# Patient Record
Sex: Male | Born: 1940 | Race: White | Hispanic: No | Marital: Married | Smoking: Never smoker
Health system: Southern US, Community
[De-identification: ages and names within clinical notes are randomized; demographics above are authoritative.]

## PROBLEM LIST (undated history)

## (undated) DIAGNOSIS — B029 Zoster without complications: Secondary | ICD-10-CM

---

## 2019-03-31 ENCOUNTER — Other Ambulatory Visit: Payer: Self-pay

## 2019-03-31 ENCOUNTER — Emergency Department
Admission: EM | Admit: 2019-03-31 | Discharge: 2019-03-31 | Disposition: A | Discharge status: Home / Self Care | Payer: Medicare Other | Source: Home / Self Care

## 2019-03-31 ENCOUNTER — Emergency Department (INDEPENDENT_AMBULATORY_CARE_PROVIDER_SITE_OTHER): Payer: Medicare Other

## 2019-03-31 DIAGNOSIS — R05 Cough: Secondary | ICD-10-CM

## 2019-03-31 DIAGNOSIS — J129 Viral pneumonia, unspecified: Secondary | ICD-10-CM

## 2019-03-31 DIAGNOSIS — J189 Pneumonia, unspecified organism: Secondary | ICD-10-CM

## 2019-03-31 DIAGNOSIS — R059 Cough, unspecified: Secondary | ICD-10-CM

## 2019-03-31 HISTORY — DX: Zoster without complications: B02.9

## 2019-03-31 LAB — POC SARS CORONAVIRUS 2 AG -  ED: SARS Coronavirus 2 Ag: NEGATIVE

## 2019-03-31 MED ORDER — AZITHROMYCIN 250 MG PO TABS: ORAL_TABLET | ORAL | 0 refills | Status: AC

## 2019-03-31 MED ORDER — ALBUTEROL SULFATE HFA 108 (90 BASE) MCG/ACT IN AERS: 2.0000 | INHALATION_SPRAY | Freq: Four times a day (QID) | RESPIRATORY_TRACT | 0 refills | Status: AC | PRN

## 2019-03-31 NOTE — ED Triage Notes (Signed)
Cough for about 1 week.

## 2019-03-31 NOTE — ED Provider Notes (Signed)
[facility]    CSN: [id number] Arrival date & time: 03/31/19  1325      History   Chief Complaint Chief Complaint  Patient presents with  . Cough    HPI Benjamin Wolf is a 78 y.o. male.   The history is provided by the patient. No language interpreter was used.  Cough Cough characteristics:  Non-productive Sputum characteristics:  Nondescript Timing:  Constant Progression:  Worsening Chronicity:  New Relieved by:  Nothing Worsened by:  Nothing Ineffective treatments:  None tried Risk factors: no recent infection   Pt complains of cough and congestion.  Pt request rx for zithromax and tussionex  Past Medical History:  Diagnosis Date  . Shingles     There are no problems to display for this patient.        Home Medications    Prior to Admission medications   Medication Sig Start Date End Date Taking? Authorizing Provider  albuterol (VENTOLIN HFA) 108 (90 Base) MCG/ACT inhaler Inhale 2 puffs into the lungs every 6 (six) hours as needed for wheezing or shortness of breath. 03/31/19   Elson Areas, PA-C  azithromycin (ZITHROMAX Z-PAK) 250 MG tablet Take 2 tablets (500 mg) on  Day 1,  followed by 1 tablet (250 mg) once daily on Days 2 through 5. 03/31/19 04/05/19  Elson Areas, PA-C    Family History No family history on file.  Social History Social History   Tobacco Use  . Smoking status: Never Smoker  . Smokeless tobacco: Never Used  Substance Use Topics  . Alcohol use: Not Currently  . Drug use: Not Currently     Allergies   Patient has no allergy information on record.   Review of Systems Review of Systems  Respiratory: Positive for cough.   All other systems reviewed and are negative.    Physical Exam Triage Vital Signs ED Triage Vitals  Enc Vitals Group     BP 03/31/19 1407 (!) 151/77     Pulse Rate 03/31/19 1407 83     Resp 03/31/19 1407 18     Temp 03/31/19 1407 98.4 F (36.9 C)     Temp Source 03/31/19 1407  Oral     SpO2 03/31/19 1407 90 %     Weight 03/31/19 1408 217 lb (98.4 kg)     Height 03/31/19 1408 5\' 9"  (1.753 m)     Head Circumference --      Peak Flow --      Pain Score 03/31/19 1408 0     Pain Loc --      Pain Edu? --      Excl. in GC? --    No data found.  Updated Vital Signs BP (!) 151/77 (BP Location: Right Arm)   Pulse 83   Temp 98.4 F (36.9 C) (Oral)   Resp 18   Ht 5\' 9"  (1.753 m)   Wt 98.4 kg   SpO2 90%   BMI 32.05 kg/m   Visual Acuity Right Eye Distance:   Left Eye Distance:   Bilateral Distance:    Right Eye Near:   Left Eye Near:    Bilateral Near:     Physical Exam Vitals and nursing note reviewed.  Constitutional:      Appearance: He is well-developed.  HENT:     Head: Normocephalic and atraumatic.  Eyes:     Conjunctiva/sclera: Conjunctivae normal.  Cardiovascular:     Rate and Rhythm: Normal rate and regular rhythm.  Heart sounds: No murmur.  Pulmonary:     Effort: Pulmonary effort is normal. No respiratory distress.     Breath sounds: Normal breath sounds.  Abdominal:     Palpations: Abdomen is soft.     Tenderness: There is no abdominal tenderness.  Musculoskeletal:     Cervical back: Neck supple.  Skin:    General: Skin is warm and dry.  Neurological:     General: No focal deficit present.     Mental Status: He is alert.      UC Treatments / Results  Labs (all labs ordered are listed, but only abnormal results are displayed) Labs Reviewed  SARS-COV-2 RNA, QUALITATIVE REAL-TIME RT-PCR  POC SARS CORONAVIRUS 2 AG -  ED    EKG   Radiology DG Chest 2 View  Result Date: 03/31/2019 CLINICAL DATA:  Cough. EXAM: CHEST - 2 VIEW COMPARISON:  None. FINDINGS: There are scattered hazy and linear airspace opacities bilaterally, for example in the right mid lung zone and at the left lung base. There is no pneumothorax. No large pleural effusion. The heart size is normal. There is no acute osseous abnormality detected on this  exam. IMPRESSION: Hazy and streaky bilateral airspace opacities as above raising concern for an atypical infectious process such as viral pneumonia in the appropriate clinical setting. Electronically Signed   By: Constance Holster M.D.   On: 03/31/2019 14:59    Procedures Procedures (including critical care time)  Medications Ordered in UC Medications - No data to display  Initial Impression / Assessment and Plan / UC Course  I have reviewed the triage vital signs and the nursing notes.  Pertinent labs & imaging results that were available during my care of the patient were reviewed by me and considered in my medical decision making (see chart for details).     *MDM  Pt counseled on reulsts.  Final Clinical Impressions(s) / UC Diagnoses   Final diagnoses:  Cough  Pneumonia due to infectious organism, unspecified laterality, unspecified part of lung  Pneumonia, viral     Discharge Instructions     Your Chest xray shows what appears to be a viral pneumonia.  I am suspicious that this is covid.  If you become more short of breath you should go to the hospital    ED Prescriptions    Medication Sig Dispense Auth. Provider   azithromycin (ZITHROMAX Z-PAK) 250 MG tablet Take 2 tablets (500 mg) on  Day 1,  followed by 1 tablet (250 mg) once daily on Days 2 through 5. 6 each Boden Stucky K, PA-C   albuterol (VENTOLIN HFA) 108 (90 Base) MCG/ACT inhaler Inhale 2 puffs into the lungs every 6 (six) hours as needed for wheezing or shortness of breath. 1 g Fransico Meadow, [place]     PDMP not reviewed this encounter.   Fransico Meadow, [place] 03/31/19 1752

## 2019-03-31 NOTE — Discharge Instructions (Addendum)
Your Chest xray shows what appears to be a viral pneumonia.  I am suspicious that this is covid.  If you become more short of breath you should go to the hospital

## 2019-04-05 ENCOUNTER — Other Ambulatory Visit: Payer: Self-pay

## 2019-04-05 ENCOUNTER — Emergency Department (INDEPENDENT_AMBULATORY_CARE_PROVIDER_SITE_OTHER)
Admission: EM | Admit: 2019-04-05 | Discharge: 2019-04-05 | Disposition: A | Discharge status: Home / Self Care | Payer: Medicare Other | Source: Home / Self Care

## 2019-04-05 DIAGNOSIS — J209 Acute bronchitis, unspecified: Secondary | ICD-10-CM

## 2019-04-05 LAB — SARS-COV-2 RNA,(COVID-19) QUALITATIVE NAAT: SARS CoV2 RNA: NOT DETECTED

## 2019-04-05 MED ORDER — PREDNISONE 10 MG PO TABS: 40.0000 mg | ORAL_TABLET | Freq: Every day | ORAL | 0 refills | Status: AC

## 2019-04-05 MED ORDER — HYDROCODONE-HOMATROPINE 5-1.5 MG/5ML PO SYRP: 5.0000 mL | ORAL_SOLUTION | Freq: Four times a day (QID) | ORAL | 0 refills | Status: AC | PRN

## 2019-04-05 MED ORDER — DOXYCYCLINE HYCLATE 100 MG PO CAPS: 100.0000 mg | ORAL_CAPSULE | Freq: Two times a day (BID) | ORAL | 0 refills | Status: AC

## 2019-04-05 NOTE — Discharge Instructions (Addendum)
As mentioned, your oxygen is low. Concerned about the possibility of COVID and hypoxia (low oxygen levels) We recommend you be evaluated in the Emergency room

## 2019-04-05 NOTE — ED Provider Notes (Signed)
[facility]    CSN: [id number] Arrival date & time: 04/05/19  1230      History   Chief Complaint Chief Complaint  Patient presents with  . Cough    HPI Benjamin Wolf is a 79 y.o. male.   79 year old male, no significant past medical history, presenting today complaining of cough.  Of note, patient was seen in this urgent care 12/31 for similar symptoms.  Had a negative Covid swab.  Chest x-ray done at that time showed: Hazy and streaky bilateral airspace opacities as above raising concern for an atypical infectious process such as viral pneumonia in the appropriate clinical setting.  Patient here today due to ongoing cough.  Patient states that this happens to him every year.  He states "I just need a good shot of codeine cough medication."   The history is provided by the patient.  Cough Cough characteristics:  Non-productive Sputum characteristics:  Nondescript Severity:  Moderate Onset quality:  Gradual Duration:  1 week Timing:  Constant Progression:  Unchanged Chronicity:  Recurrent Smoker: no   Context: not animal exposure, not exposure to allergens and not fumes   Relieved by:  Nothing Worsened by:  Nothing Ineffective treatments:  Rest Associated symptoms: shortness of breath   Associated symptoms: no chest pain, no chills, no diaphoresis, no ear fullness, no ear pain, no eye discharge, no fever, no headaches, no myalgias, no rash, no rhinorrhea, no sinus congestion, no sore throat, no weight loss and no wheezing   Risk factors: no chemical exposure, no recent infection and no recent travel     Past Medical History:  Diagnosis Date  . Shingles     There are no problems to display for this patient.   History reviewed. No pertinent surgical history.     Home Medications    Prior to Admission medications   Medication Sig Start Date End Date Taking? Authorizing Provider  albuterol (VENTOLIN HFA) 108 (90 Base) MCG/ACT inhaler Inhale 2 puffs  into the lungs every 6 (six) hours as needed for wheezing or shortness of breath. 03/31/19   Elson Areas, PA-C  azithromycin (ZITHROMAX Z-PAK) 250 MG tablet Take 2 tablets (500 mg) on  Day 1,  followed by 1 tablet (250 mg) once daily on Days 2 through 5. 03/31/19 04/05/19  Elson Areas, PA-C  doxycycline (VIBRAMYCIN) 100 MG capsule Take 1 capsule (100 mg total) by mouth 2 (two) times daily. 04/05/19   Nataniel Gasper C, PA-C  HYDROcodone-homatropine (HYCODAN) 5-1.5 MG/5ML syrup Take 5 mLs by mouth every 6 (six) hours as needed for cough. 04/05/19   Adaly Puder C, PA-C  predniSONE (DELTASONE) 10 MG tablet Take 4 tablets (40 mg total) by mouth daily for 5 days. 04/05/19 04/10/19  Alecia Lemming, PA-C    Family History History reviewed. No pertinent family history.  Social History Social History   Tobacco Use  . Smoking status: Never Smoker  . Smokeless tobacco: Never Used  Substance Use Topics  . Alcohol use: Not Currently  . Drug use: Not Currently     Allergies   Patient has no known allergies.   Review of Systems Review of Systems  Constitutional: Negative for chills, diaphoresis, fever and weight loss.  HENT: Negative for ear pain, rhinorrhea and sore throat.   Eyes: Negative for pain, discharge and visual disturbance.  Respiratory: Positive for cough and shortness of breath. Negative for wheezing.   Cardiovascular: Negative for chest pain and palpitations.  Gastrointestinal: Negative for  abdominal pain and vomiting.  Genitourinary: Negative for dysuria and hematuria.  Musculoskeletal: Negative for arthralgias, back pain and myalgias.  Skin: Negative for color change and rash.  Neurological: Negative for seizures, syncope and headaches.  All other systems reviewed and are negative.    Physical Exam Triage Vital Signs ED Triage Vitals  Enc Vitals Group     BP      Pulse      Resp      Temp      Temp src      SpO2      Weight      Height      Head Circumference       Peak Flow      Pain Score      Pain Loc      Pain Edu?      Excl. in [place]?    No data found.  Updated Vital Signs BP (!) 143/80 (BP Location: Right Arm)   Pulse 93   Temp 98.7 F (37.1 C) (Oral)   Resp 20   Ht 5\' 9"  (1.753 m)   Wt 217 lb (98.4 kg)   SpO2 91%   BMI 32.05 kg/m   Visual Acuity Right Eye Distance:   Left Eye Distance:   Bilateral Distance:    Right Eye Near:   Left Eye Near:    Bilateral Near:     Physical Exam Vitals and nursing note reviewed.  Constitutional:      Appearance: He is well-developed.  HENT:     Head: Normocephalic and atraumatic.  Eyes:     Conjunctiva/sclera: Conjunctivae normal.  Cardiovascular:     Rate and Rhythm: Normal rate and regular rhythm.     Heart sounds: No murmur.  Pulmonary:     Effort: Pulmonary effort is normal. No respiratory distress.     Breath sounds: Normal breath sounds.  Abdominal:     Palpations: Abdomen is soft.     Tenderness: There is no abdominal tenderness.  Musculoskeletal:     Cervical back: Neck supple.  Skin:    General: Skin is warm and dry.  Neurological:     Mental Status: He is alert.      UC Treatments / Results  Labs (all labs ordered are listed, but only abnormal results are displayed) Labs Reviewed  NOVEL CORONAVIRUS, NAA    EKG   Radiology No results found.  Procedures Procedures (including critical care time)  Medications Ordered in UC Medications - No data to display  Initial Impression / Assessment and Plan / UC Course  I have reviewed the triage vital signs and the nursing notes.  Pertinent labs & imaging results that were available during my care of the patient were reviewed by me and considered in my medical decision making (see chart for details).     Patient here today due to ongoing cough and congestion.  States that this happens to him every year and he needs codeine cough medication.  Chest x-ray consistent with a viral pneumonia done during his recent  visit.  Covid negative.  Will reswab today.  Patient did have a room air saturation of 90 to 91%.  Patient states that this is always the case.  Explained to the patient that he possibly has Covid and with his hypoxia, recommend emergency room evaluation.  Patient declines and states that this hypoxia is ongoing and unchanged.  He is adamant that he does not need to be evaluated in the emergency  room despite my recommendation. Final Clinical Impressions(s) / UC Diagnoses   Final diagnoses:  Acute bronchitis, unspecified organism     Discharge Instructions     As mentioned, your oxygen is low. Concerned about the possibility of COVID and hypoxia (low oxygen levels) We recommend you be evaluated in the Emergency room    ED Prescriptions    Medication Sig Dispense Auth. Provider   doxycycline (VIBRAMYCIN) 100 MG capsule Take 1 capsule (100 mg total) by mouth 2 (two) times daily. 20 capsule Tallan Sandoz C, PA-C   predniSONE (DELTASONE) 10 MG tablet Take 4 tablets (40 mg total) by mouth daily for 5 days. 20 tablet Seville Brick C, PA-C   HYDROcodone-homatropine (HYCODAN) 5-1.5 MG/5ML syrup Take 5 mLs by mouth every 6 (six) hours as needed for cough. 120 mL Aaryanna Hyden C, PA-C     PDMP not reviewed this encounter.   Alecia Lemming, [place] 04/05/19 1309

## 2019-04-05 NOTE — ED Triage Notes (Signed)
Pt is here for a strong cough that will not resolve

## 2019-04-07 LAB — NOVEL CORONAVIRUS, NAA: SARS-CoV-2, NAA: NOT DETECTED

## 2021-07-09 IMAGING — DX DG CHEST 2V
2 series · 2 of 2 positions shown · non-contrast
Comparison: None.

CLINICAL DATA: Cough.

EXAM:
CHEST - 2 VIEW

[chest pa]
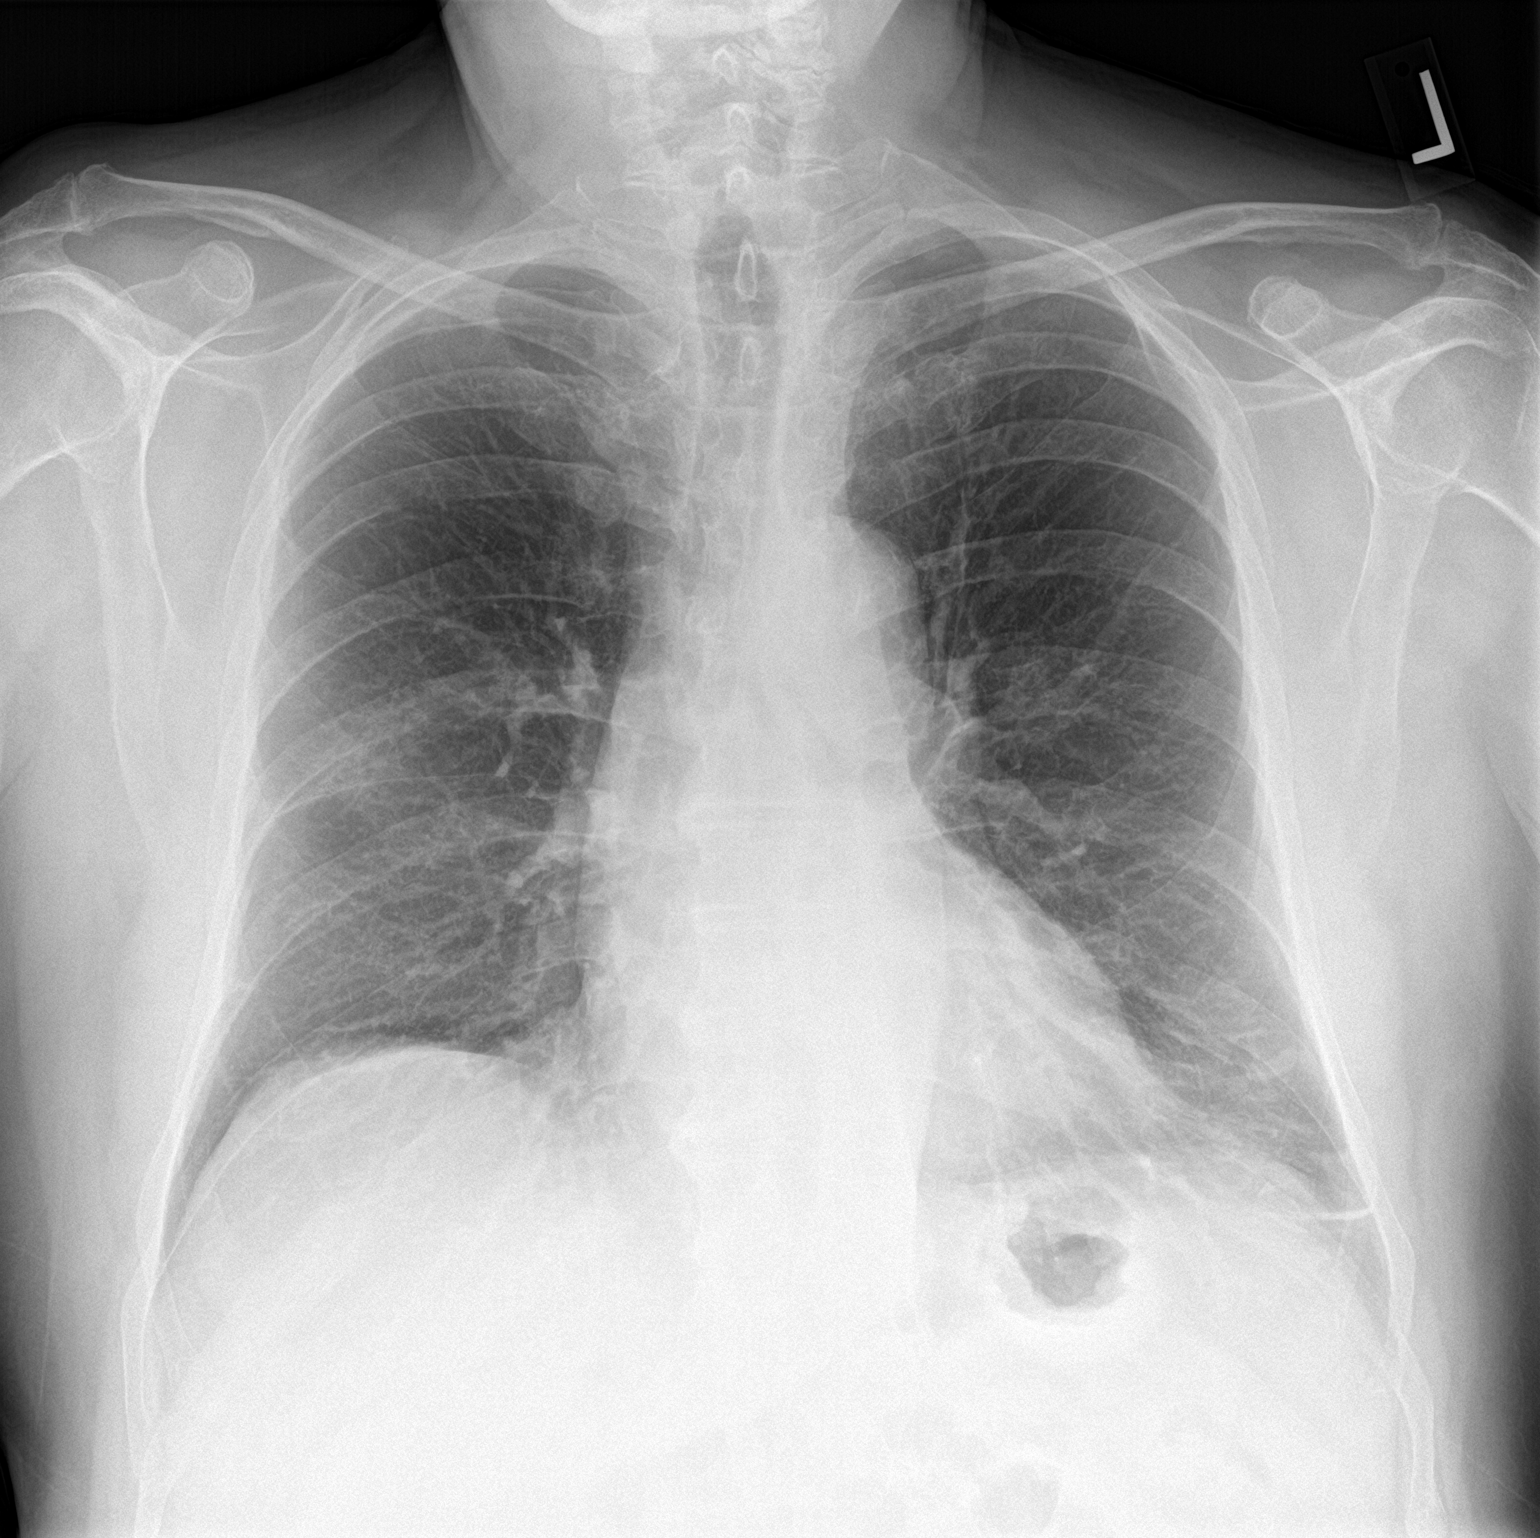

[chest lat]
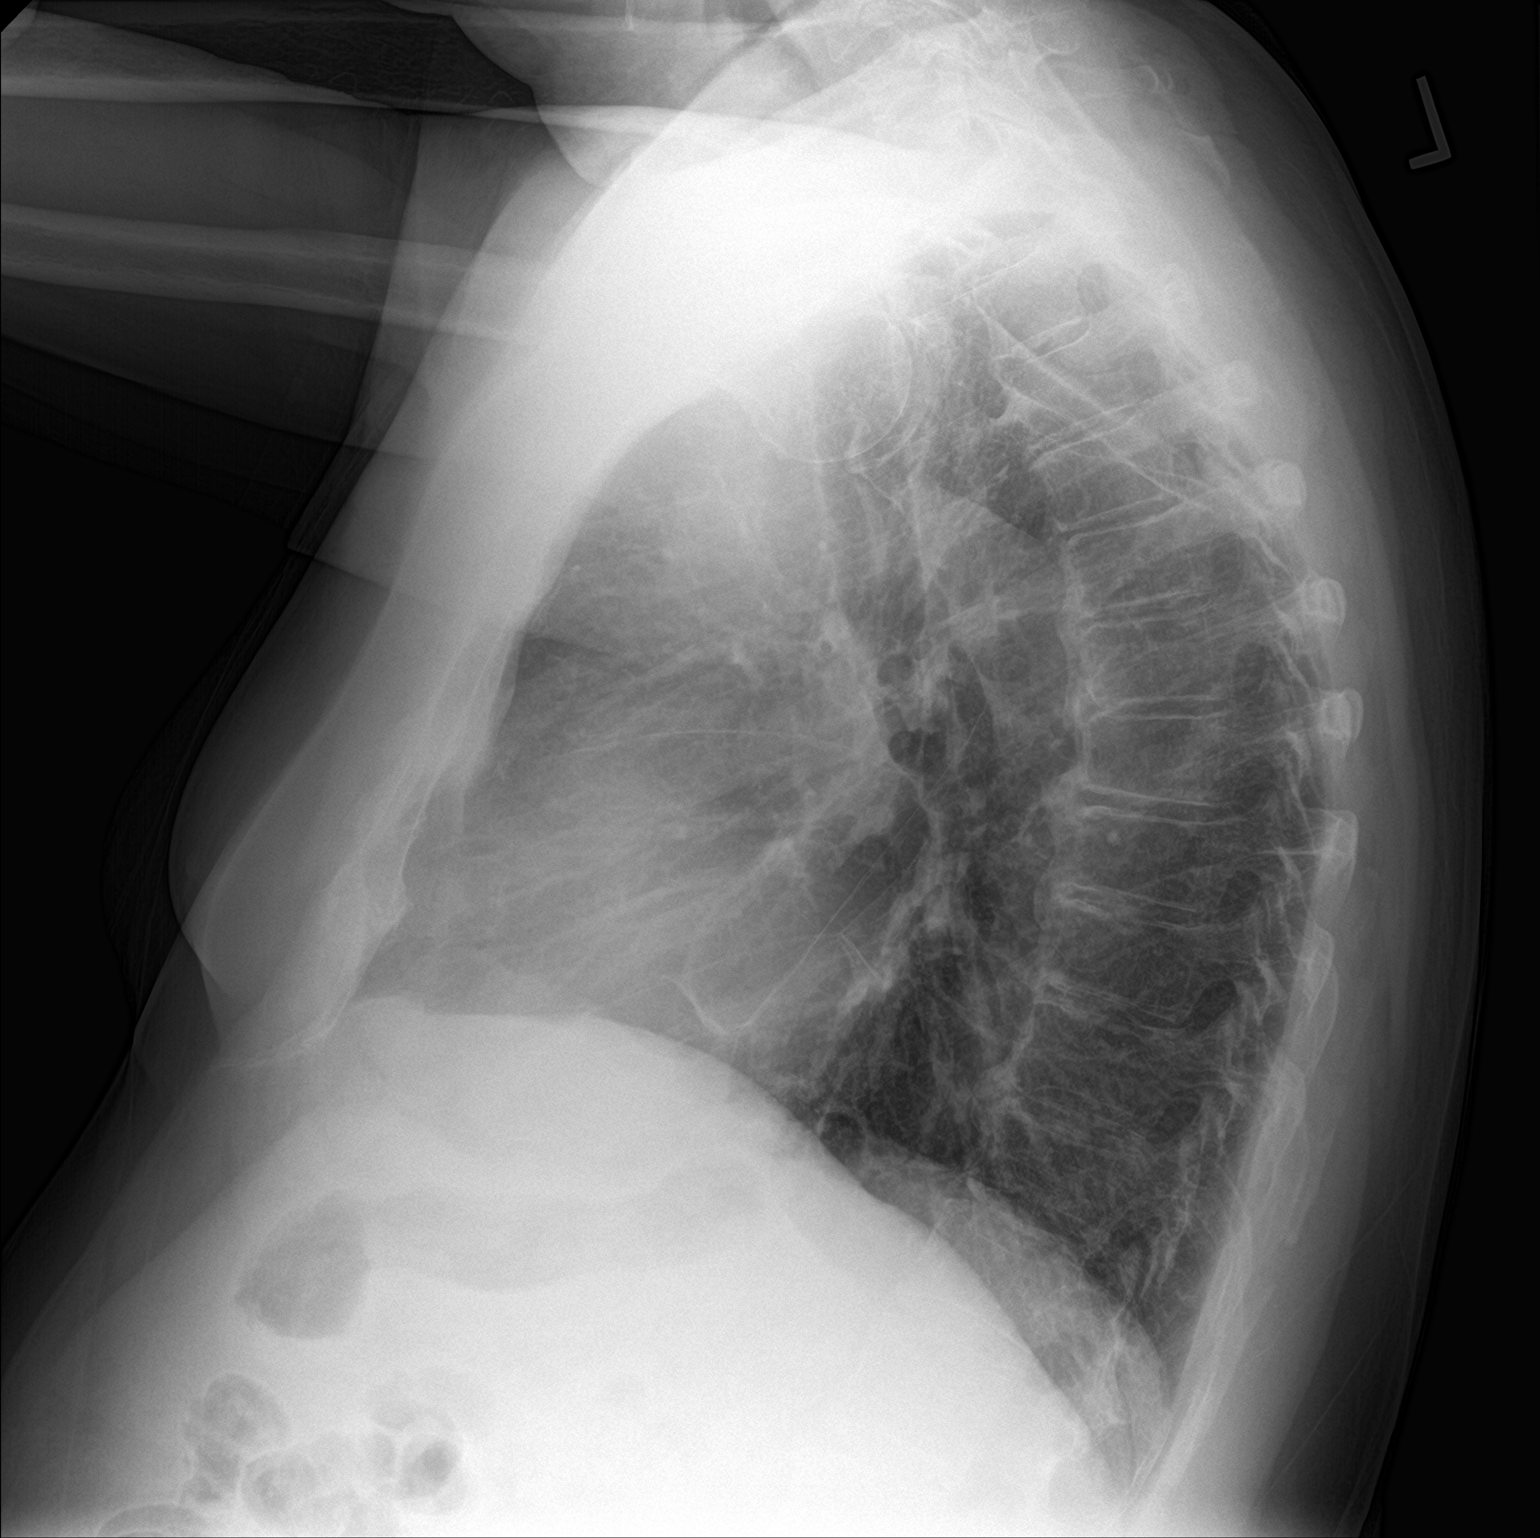

[2 of 2 positions shown; findings below may reference images not displayed]

FINDINGS: There are scattered hazy and linear airspace opacities bilaterally,
for example in the right mid lung zone and at the left lung base.
There is no pneumothorax. No large pleural effusion. The heart size
is normal. There is no acute osseous abnormality detected on this
exam.
IMPRESSION: Hazy and streaky bilateral airspace opacities as above raising
concern for an atypical infectious process such as viral pneumonia
in the appropriate clinical setting.

## 2022-07-10 ENCOUNTER — Ambulatory Visit
Admission: EM | Admit: 2022-07-10 | Discharge: 2022-07-10 | Disposition: A | Discharge status: Home / Self Care | Payer: Medicare Other

## 2022-07-10 ENCOUNTER — Encounter: Payer: Self-pay | Admitting: Emergency Medicine

## 2022-07-10 DIAGNOSIS — S0990XA Unspecified injury of head, initial encounter: Secondary | ICD-10-CM | POA: Diagnosis not present

## 2022-07-10 NOTE — ED Provider Notes (Signed)
[facility]    CSN: [id number] Arrival date & time: 07/10/22  1024      History   Chief Complaint Chief Complaint  Patient presents with   Fall    HPI Benjamin Wolf is a 82 y.o. male.   HPI 82 year old male presents with head injury this morning.  Reports falling outside and hitting and hit his head on brick wall.  Patient reports falling backwards and no loss of consciousness.  Reports hitting head with great force.  Reports current blurred vision.  Denies headache.  Patient is accompanied by his daughter and wife this morning. PMH significant for lateral occipital neuralgia, vitamin B12 deficiency, and neuropathic pain.  Past Medical History:  Diagnosis Date   Shingles     There are no problems to display for this patient.   History reviewed. No pertinent surgical history.     Home Medications    Prior to Admission medications   Medication Sig Start Date End Date Taking? Authorizing Provider  tamsulosin (FLOMAX) 0.4 MG CAPS capsule Take 1 capsule by mouth daily. 06/24/22 06/19/23 Yes [provider]  albuterol (VENTOLIN HFA) 108 (90 Base) MCG/ACT inhaler Inhale 2 puffs into the lungs every 6 (six) hours as needed for wheezing or shortness of breath. 03/31/19   Elson Areas, PA-C  doxycycline (VIBRAMYCIN) 100 MG capsule Take 1 capsule (100 mg total) by mouth 2 (two) times daily. 04/05/19   Blue, Olivia C, PA-C  HYDROcodone-homatropine (HYCODAN) 5-1.5 MG/5ML syrup Take 5 mLs by mouth every 6 (six) hours as needed for cough. 04/05/19   Blue, Marylene Land, PA-C    Family History History reviewed. No pertinent family history.  Social History Social History   Tobacco Use   Smoking status: Never   Smokeless tobacco: Never  Vaping Use   Vaping Use: Never used  Substance Use Topics   Alcohol use: Not Currently   Drug use: Not Currently     Allergies   Patient has no known allergies.   Review of Systems Review of Systems  Eyes:  Positive for  visual disturbance.  All other systems reviewed and are negative.    Physical Exam Triage Vital Signs ED Triage Vitals  Enc Vitals Group     BP 07/10/22 1114 (!) 155/94     Pulse Rate 07/10/22 1114 72     Resp 07/10/22 1114 16     Temp 07/10/22 1114 98 F (36.7 C)     Temp Source 07/10/22 1114 Oral     SpO2 07/10/22 1114 92 %     Weight 07/10/22 1116 207 lb (93.9 kg)     Height 07/10/22 1116 5\' 10"  (1.778 m)     Head Circumference --      Peak Flow --      Pain Score 07/10/22 1116 6     Pain Loc --      Pain Edu? --      Excl. in GC? --    No data found.  Updated Vital Signs BP (!) 155/94 (BP Location: Left Arm)   Pulse 72   Temp 98 F (36.7 C) (Oral)   Resp 16   Ht 5\' 10"  (1.778 m)   Wt 207 lb (93.9 kg)   SpO2 92%   BMI 29.70 kg/m   Visual Acuity Right Eye Distance:   Left Eye Distance:   Bilateral Distance:    Right Eye Near:   Left Eye Near:    Bilateral Near:  Physical Exam Vitals and nursing note reviewed.  Constitutional:      Appearance: Normal appearance. He is normal weight.  HENT:     Head: Normocephalic and atraumatic.     Comments: Central occipital/parietal region: 2.5 cm x 2.5 cm square shaped, nonbleeding, erythematous skin avulsion noted    Right Ear: Tympanic membrane, ear canal and external ear normal.     Left Ear: Tympanic membrane, ear canal and external ear normal.     Mouth/Throat:     Mouth: Mucous membranes are moist.     Pharynx: Oropharynx is clear.  Eyes:     Extraocular Movements: Extraocular movements intact.     Conjunctiva/sclera: Conjunctivae normal.     Pupils: Pupils are equal, round, and reactive to light.  Cardiovascular:     Rate and Rhythm: Normal rate and regular rhythm.     Pulses: Normal pulses.     Heart sounds: Normal heart sounds.  Pulmonary:     Effort: Pulmonary effort is normal.     Breath sounds: Normal breath sounds. No wheezing, rhonchi or rales.  Musculoskeletal:        General: Normal range  of motion.     Cervical back: Normal range of motion and neck supple.  Skin:    General: Skin is warm and dry.  Neurological:     General: No focal deficit present.     Mental Status: He is alert and oriented to person, place, and time. Mental status is at baseline.     Cranial Nerves: No cranial nerve deficit.     Motor: No weakness.     Coordination: Coordination normal.  Psychiatric:        Mood and Affect: Mood normal.        Behavior: Behavior normal.        Thought Content: Thought content normal.      UC Treatments / Results  Labs (all labs ordered are listed, but only abnormal results are displayed) Labs Reviewed - No data to display  EKG   Radiology No results found.  Procedures Procedures (including critical care time)  Medications Ordered in UC Medications - No data to display  Initial Impression / Assessment and Plan / UC Course  I have reviewed the triage vital signs and the nursing notes.  Pertinent labs & imaging results that were available during my care of the patient were reviewed by me and considered in my medical decision making (see chart for details).     MDM: 1.  Injury of head, initial encounter-patient neuro exam within normal limits. Advised patient/family to go to [facility] ED now for further evaluation of head injury and fall.  Discharged home, hemodynamically stable. Final Clinical Impressions(s) / UC Diagnoses   Final diagnoses:  Injury of head, initial encounter     Discharge Instructions      Advised patient/family to go to [facility] ED now for further evaluation of head injury and fall.   ED Prescriptions   None    PDMP not reviewed this encounter.   Trevor Iha, FNP 07/10/22 1143

## 2022-07-10 NOTE — ED Triage Notes (Signed)
Patient states that he fell this morning outside and hit his head on a brick.  Patient fell backwards.  No loss of consciousness.  Patient is currently having pain in the back of head.  Some blurred vision.

## 2022-07-10 NOTE — Discharge Instructions (Addendum)
Advised patient/family to go to [facility] ED now for further evaluation of head injury and fall.

## 2022-07-10 NOTE — ED Notes (Signed)
Patient is being discharged from the Urgent Care and sent to the Emergency Department via private vehicle . Per Casimiro Needle Ragan,NP patient is in need of higher level of care due to further evaluation. Patient is aware and verbalizes understanding of plan of care.  Vitals:   07/10/22 1114  BP: (!) 155/94  Pulse: 72  Resp: 16  Temp: 98 F (36.7 C)  SpO2: 92%
